# Patient Record
Sex: Male | Born: 1980 | Race: White | Hispanic: No | Marital: Married | Smoking: Current every day smoker
Health system: Southern US, Community
[De-identification: ages and names within clinical notes are randomized; demographics above are authoritative.]

## PROBLEM LIST (undated history)

## (undated) DIAGNOSIS — B009 Herpesviral infection, unspecified: Secondary | ICD-10-CM

---

## 2009-08-19 ENCOUNTER — Ambulatory Visit: Payer: Self-pay | Admitting: Family Medicine

## 2009-08-19 DIAGNOSIS — S058X9A Other injuries of unspecified eye and orbit, initial encounter: Secondary | ICD-10-CM | POA: Insufficient documentation

## 2010-07-04 NOTE — Assessment & Plan Note (Signed)
Summary: LEFT EYE PAIN   Vital Signs:  Patient Profile:   30 Years Old Male CC:      left eye pain X this AM Height:     69 inches Weight:      154 pounds O2 Sat:      97 % O2 treatment:    Room Air Temp:     98.4 degrees F oral Pulse rate:   77 / minute Resp:     18 per minute BP sitting:   137 / 85  (right arm)  Pt. in pain?   yes    Location:   left eye    Type:       burning  Vitals Entered By: Lajean Saver RN (August 19, 2009 1:06 PM)               Vision Screening: Left eye w/o correction: 20 / 25 Right Eye w/o correction: 20 / 20 Both eyes w/o correction:  20/ 20          Updated Prior Medication List: No Medications Current Allergies: ! PAXIL (PAROXETINE HCL)History of Present Illness Chief Complaint: left eye pain X this AM History of Present Illness: Subjective:  Patient felt something in his left eye this morning and rubbed it with his sleeve. He then lavaged the eye with an eye solution.  He now has a persistent foreign body sensation.   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  REVIEW OF SYSTEMS Constitutional Symptoms      Denies fever, chills, night sweats, weight loss, weight gain, and fatigue.  Eyes       Complains of eye pain.      Denies change in vision, eye discharge, glasses, contact lenses, and eye surgery.      Comments: left eye Ear/Nose/Throat/Mouth       Denies hearing loss/aids, change in hearing, ear pain, ear discharge, dizziness, frequent runny nose, frequent nose bleeds, sinus problems, sore throat, hoarseness, and tooth pain or bleeding.  Respiratory       Denies dry cough, productive cough, wheezing, shortness of breath, asthma, bronchitis, and emphysema/COPD.  Cardiovascular       Denies murmurs, chest pain, and tires easily with exhertion.    Gastrointestinal       Denies stomach pain, nausea/vomiting, diarrhea, constipation, blood in bowel movements, and indigestion. Genitourniary       Denies painful urination, kidney  stones, and loss of urinary control. Neurological       Denies paralysis, seizures, and fainting/blackouts. Musculoskeletal       Denies muscle pain, joint pain, joint stiffness, decreased range of motion, redness, swelling, muscle weakness, and gout.  Skin       Denies bruising, unusual mles/lumps or sores, and hair/skin or nail changes.  Psych       Denies mood changes, temper/anger issues, anxiety/stress, speech problems, depression, and sleep problems. Other Comments: Patient awoke this AM and felt like "something was in my eye". attempted to flush it out with water usuccessfully   Past History:  Past Medical History: Unremarkable  Past Surgical History: Denies surgical history  Social History: Occupation: HVAC Married Current Smoker 1/2 PPD X 9 years Alcohol use-yes Drug use-no Smoking Status:  current Drug Use:  no   Objective:  Appearance:  Patient appears healthy, stated age, and in no acute distress  Eyes:  Pupils are equal, round, and reactive to light and accomdation.  Extraocular movement is intact.  Conjunctivae are not inflamed.  Fundi benign.  Mild left photophobia.  Instilled tetracaine anesthetic in left eye  Left lid eversion negative for foreign body.  Flourescein to left eye shows a superficial linear abrasion 1mm by 4mm on the lower aspect of cornea Assessment New Problems: CORNEAL ABRASION, LEFT (ICD-918.1)   Plan New Medications/Changes: LORTAB 5 5-500 MG TABS (HYDROCODONE-ACETAMINOPHEN) One or two tabs by mouth hs as needed pain  #8 (eight) x 0, 08/19/2009, Donna Christen MD ERYTHROMYCIN 5 MG/GM OINT (ERYTHROMYCIN) Apply in affected eye q2 to 3hr  #3.5gm x 0, 08/19/2009, Donna Christen MD  New Orders: New Patient Level III [id number] Planning Comments:   Begin erythromycin opthalmic ointment.  Rx for analgesic at bedtime. Follow-up with ophthalmologist if not healed 2 to 3 days.  Follow-up also for worsenng symptoms   The patient and/or caregiver  has been counseled thoroughly with regard to medications prescribed including dosage, schedule, interactions, rationale for use, and possible side effects and they verbalize understanding.  Diagnoses and expected course of recovery discussed and will return if not improved as expected or if the condition worsens. Patient and/or caregiver verbalized understanding.  Prescriptions: LORTAB 5 5-500 MG TABS (HYDROCODONE-ACETAMINOPHEN) One or two tabs by mouth hs as needed pain  #8 (eight) x 0   Entered and Authorized by:   Donna Christen MD   Signed by:   Donna Christen MD on 08/19/2009   Method used:   Print then Give to Patient   RxID:   [id number] ERYTHROMYCIN 5 MG/GM OINT (ERYTHROMYCIN) Apply in affected eye q2 to 3hr  #3.5gm x 0   Entered and Authorized by:   Donna Christen MD   Signed by:   Donna Christen MD on 08/19/2009   Method used:   Print then Give to Patient   RxID:   [id number]   Patient Instructions: 1)  May take Ibuprofen 200mg , 4 tabs every 8 hours with food  2)  Wear sunglasses. 3)  Followup with ophthalmologist if not improved 2 days.

## 2012-02-20 ENCOUNTER — Emergency Department (HOSPITAL_BASED_OUTPATIENT_CLINIC_OR_DEPARTMENT_OTHER)
Admission: EM | Admit: 2012-02-20 | Discharge: 2012-02-21 | Disposition: A | Discharge status: Home / Self Care | Payer: Self-pay | Attending: Emergency Medicine | Admitting: Emergency Medicine

## 2012-02-20 ENCOUNTER — Encounter (HOSPITAL_BASED_OUTPATIENT_CLINIC_OR_DEPARTMENT_OTHER): Payer: Self-pay

## 2012-02-20 DIAGNOSIS — A64 Unspecified sexually transmitted disease: Secondary | ICD-10-CM

## 2012-02-20 DIAGNOSIS — F17200 Nicotine dependence, unspecified, uncomplicated: Secondary | ICD-10-CM | POA: Insufficient documentation

## 2012-02-20 DIAGNOSIS — B009 Herpesviral infection, unspecified: Secondary | ICD-10-CM | POA: Insufficient documentation

## 2012-02-20 DIAGNOSIS — N342 Other urethritis: Secondary | ICD-10-CM

## 2012-02-20 DIAGNOSIS — Z202 Contact with and (suspected) exposure to infections with a predominantly sexual mode of transmission: Secondary | ICD-10-CM | POA: Insufficient documentation

## 2012-02-20 DIAGNOSIS — R300 Dysuria: Secondary | ICD-10-CM | POA: Insufficient documentation

## 2012-02-20 HISTORY — DX: Herpesviral infection, unspecified: B00.9

## 2012-02-20 LAB — URINALYSIS, ROUTINE W REFLEX MICROSCOPIC
Bilirubin Urine: NEGATIVE
Hgb urine dipstick: NEGATIVE
Specific Gravity, Urine: 1.029 (ref 1.005–1.030)
Urobilinogen, UA: 0.2 mg/dL (ref 0.0–1.0)
pH: 6 (ref 5.0–8.0)

## 2012-02-20 LAB — URINE MICROSCOPIC-ADD ON

## 2012-02-20 MED ORDER — AZITHROMYCIN 1 G PO PACK
2.0000 g | PACK | Freq: Once | ORAL | Status: AC
  Administered 2012-02-20: 2 g via ORAL
  Filled 2012-02-20: qty 2

## 2012-02-20 MED ORDER — CEFTRIAXONE SODIUM 250 MG IJ SOLR
250.0000 mg | Freq: Once | INTRAMUSCULAR | Status: AC
  Administered 2012-02-20: 250 mg via INTRAMUSCULAR
  Filled 2012-02-20: qty 250

## 2012-02-20 NOTE — ED Provider Notes (Signed)
History     CSN: [id number]  Arrival date & time 02/20/12  2152   First MD Initiated Contact with Patient 02/20/12 2249      Chief Complaint  Patient presents with  . Dysuria    (Consider location/radiation/quality/duration/timing/severity/associated sxs/prior treatment) HPI Maxwell Houston is a 31 y.o. male no pertinent medical history presents with 3 days history of intermittent dysuria which is described as 2/10, burning he does have some clear to off white discharge. Denies any hematuria, testicular pain, rectal pain, fevers, chills nausea or vomiting. Patient's ex-girlfriend called him today and told him she had chlamydia, he had sexual intercourse with her about 2 weeks ago.  Past Medical History  Diagnosis Date  . Herpes     History reviewed. No pertinent past surgical history.  No family history on file.  History  Substance Use Topics  . Smoking status: Current Every Day Smoker  . Smokeless tobacco: Not on file  . Alcohol Use: Yes      Review of SystemsAt least 10pt or greater review of systems completed and are negative except where specified in the HPI.   Allergies  Paroxetine  Home Medications  No current outpatient prescriptions on file.  BP 125/69  Pulse 67  Temp 98.2 F (36.8 C) (Oral)  Resp 16  Ht 5\' 10"  (1.778 m)  Wt 165 lb (74.844 kg)  BMI 23.68 kg/m2  SpO2 98%  Physical Exam  Nursing notes reviewed.  Electronic medical record reviewed. VITAL SIGNS:   Filed Vitals:   02/20/12 2200  BP: 125/69  Pulse: 67  Temp: 98.2 F (36.8 C)  TempSrc: Oral  Resp: 16  Height: 5\' 10"  (1.778 m)  Weight: 165 lb (74.844 kg)  SpO2: 98%   CONSTITUTIONAL: Awake, oriented, appears non-toxic HENT: Atraumatic, normocephalic, oral mucosa pink and moist, airway patent. Nares patent without drainage. External ears normal. EYES: Conjunctiva clear, EOMI, PERRLA NECK: Trachea midline, non-tender, supple CARDIOVASCULAR: Normal heart rate, Normal rhythm, No  murmurs, rubs, gallops PULMONARY/CHEST: Clear to auscultation, no rhonchi, wheezes, or rales. Symmetrical breath sounds. Non-tender. ABDOMINAL: Non-distended, soft, non-tender - no rebound or guarding.  BS normal. Small ventral hernia midway between the xiphoid process and umbilicus GU: Normal circumcised male, thin gray opaque penile discharge. No rashes or sores. No lymphadenopathy. No testicular pain, no hernias EXTREMITIES: No clubbing, cyanosis, or edema SKIN: Warm, Dry, No erythema, No rash  ED Course  Procedures (including critical care time)  Labs Reviewed  URINALYSIS, ROUTINE W REFLEX MICROSCOPIC - Abnormal; Notable for the following:    Leukocytes, UA MODERATE (*)     All other components within normal limits  URINE MICROSCOPIC-ADD ON - Abnormal; Notable for the following:    Squamous Epithelial / LPF FEW (*)     Bacteria, UA FEW (*)     All other components within normal limits  GC/CHLAMYDIA PROBE AMP, GENITAL   No results found.   No diagnosis found.    MDM  Maxwell Houston is a 31 y.o. male presenting with dysuria, urethritis with the patient's history likely has chlamydia perhaps also gonorrhea. We'll treat the patient for both, swab has been sent for GC Chlamydia, he is referred to the health department for further testing for HIV/syphilis. Patient has not had sex with anybody else, no else is been exposed.  He understands to follow up with health department for further testing.  Discharged home in good condition: Stable.        Jones Skene, MD 02/20/12 2359

## 2012-02-20 NOTE — ED Notes (Signed)
C/o dysuria x 1 week-was advised today of STD exposure

## 2012-02-21 LAB — GC/CHLAMYDIA PROBE AMP, GENITAL
Chlamydia, DNA Probe: POSITIVE — AB
GC Probe Amp, Genital: NEGATIVE

## 2012-02-22 ENCOUNTER — Telehealth (HOSPITAL_COMMUNITY): Payer: Self-pay | Admitting: *Deleted

## 2012-02-22 NOTE — ED Notes (Signed)
+   chlamydia Patient treated with rocephin and Zithromax.[facility] letter

## 2012-02-27 ENCOUNTER — Telehealth (HOSPITAL_COMMUNITY): Payer: Self-pay | Admitting: *Deleted

## 2012-02-27 NOTE — ED Notes (Signed)
Unable to contact via phone. letter sent to EDP office.

## 2012-09-15 ENCOUNTER — Ambulatory Visit (INDEPENDENT_AMBULATORY_CARE_PROVIDER_SITE_OTHER): Payer: Self-pay | Admitting: Family Medicine

## 2012-09-15 ENCOUNTER — Encounter: Payer: Self-pay | Admitting: Family Medicine

## 2012-09-15 VITALS — BP 113/68 | HR 51 | Ht 68.5 in | Wt 150.0 lb

## 2012-09-15 DIAGNOSIS — Z0000 Encounter for general adult medical examination without abnormal findings: Secondary | ICD-10-CM

## 2012-09-15 DIAGNOSIS — Z024 Encounter for examination for driving license: Secondary | ICD-10-CM
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
## 2015-03-08 ENCOUNTER — Emergency Department (HOSPITAL_BASED_OUTPATIENT_CLINIC_OR_DEPARTMENT_OTHER): Payer: Self-pay

## 2015-03-08 ENCOUNTER — Encounter (HOSPITAL_BASED_OUTPATIENT_CLINIC_OR_DEPARTMENT_OTHER): Payer: Self-pay | Admitting: *Deleted

## 2015-03-08 ENCOUNTER — Emergency Department (HOSPITAL_BASED_OUTPATIENT_CLINIC_OR_DEPARTMENT_OTHER)
Admission: EM | Admit: 2015-03-08 | Discharge: 2015-03-08 | Disposition: A | Discharge status: Home / Self Care | Payer: Self-pay | Attending: Emergency Medicine | Admitting: Emergency Medicine

## 2015-03-08 DIAGNOSIS — R109 Unspecified abdominal pain: Secondary | ICD-10-CM

## 2015-03-08 DIAGNOSIS — Z720 Tobacco use: Secondary | ICD-10-CM | POA: Insufficient documentation

## 2015-03-08 DIAGNOSIS — Z8619 Personal history of other infectious and parasitic diseases: Secondary | ICD-10-CM | POA: Insufficient documentation

## 2015-03-08 DIAGNOSIS — N200 Calculus of kidney: Secondary | ICD-10-CM | POA: Insufficient documentation

## 2015-03-08 LAB — COMPREHENSIVE METABOLIC PANEL
ALT: 25 U/L (ref 17–63)
AST: 23 U/L (ref 15–41)
Albumin: 4.5 g/dL (ref 3.5–5.0)
Alkaline Phosphatase: 59 U/L (ref 38–126)
Anion gap: 5 (ref 5–15)
BUN: 13 mg/dL (ref 6–20)
CHLORIDE: 107 mmol/L (ref 101–111)
CO2: 26 mmol/L (ref 22–32)
CREATININE: 1.16 mg/dL (ref 0.61–1.24)
Calcium: 9.3 mg/dL (ref 8.9–10.3)
GFR calc Af Amer: >60 mL/min (ref >60)
Glucose, Bld: 124 mg/dL — ABNORMAL HIGH (ref 65–99)
Potassium: 3.5 mmol/L (ref 3.5–5.1)
Sodium: 138 mmol/L (ref 135–145)
Total Bilirubin: 0.8 mg/dL (ref 0.3–1.2)
Total Protein: 7.4 g/dL (ref 6.5–8.1)

## 2015-03-08 LAB — URINE MICROSCOPIC-ADD ON

## 2015-03-08 LAB — CBC WITH DIFFERENTIAL/PLATELET
BASOS ABS: 0 10*3/uL (ref 0.0–0.1)
Basophils Relative: 0 %
EOS PCT: 1 %
Eosinophils Absolute: 0.1 10*3/uL (ref 0.0–0.7)
HEMATOCRIT: 46.6 % (ref 39.0–52.0)
HEMOGLOBIN: 15.9 g/dL (ref 13.0–17.0)
LYMPHS PCT: 38 %
Lymphs Abs: 3.1 10*3/uL (ref 0.7–4.0)
MCH: 30.5 pg (ref 26.0–34.0)
MCHC: 34.1 g/dL (ref 30.0–36.0)
MCV: 89.3 fL (ref 78.0–100.0)
Monocytes Absolute: 0.8 10*3/uL (ref 0.1–1.0)
Monocytes Relative: 10 %
NEUTROS ABS: 4.2 10*3/uL (ref 1.7–7.7)
NEUTROS PCT: 51 %
PLATELETS: 165 10*3/uL (ref 150–400)
RBC: 5.22 MIL/uL (ref 4.22–5.81)
RDW: 13.1 % (ref 11.5–15.5)
WBC: 8.2 10*3/uL (ref 4.0–10.5)

## 2015-03-08 LAB — URINALYSIS, ROUTINE W REFLEX MICROSCOPIC
Bilirubin Urine: NEGATIVE
GLUCOSE, UA: NEGATIVE mg/dL
KETONES UR: 15 mg/dL — AB
LEUKOCYTES UA: NEGATIVE
Nitrite: NEGATIVE
PH: 6 (ref 5.0–8.0)
Protein, ur: 100 mg/dL — AB
Specific Gravity, Urine: 1.027 (ref 1.005–1.030)
Urobilinogen, UA: 1 mg/dL (ref 0.0–1.0)

## 2015-03-08 LAB — RAPID HIV SCREEN (HIV 1/2 AB+AG)
HIV 1/2 Antibodies: NONREACTIVE
HIV-1 P24 ANTIGEN - HIV24: NONREACTIVE

## 2015-03-08 LAB — LIPASE, BLOOD: LIPASE: 18 U/L — AB (ref 22–51)

## 2015-03-08 LAB — I-STAT CG4 LACTIC ACID, ED: Lactic Acid, Venous: 1.35 mmol/L (ref 0.5–2.0)

## 2015-03-08 MED ORDER — MORPHINE SULFATE (PF) 4 MG/ML IV SOLN
4.0000 mg | Freq: Once | INTRAVENOUS | Status: AC
  Administered 2015-03-08: 4 mg via INTRAVENOUS
  Filled 2015-03-08: qty 1

## 2015-03-08 MED ORDER — KETOROLAC TROMETHAMINE 30 MG/ML IJ SOLN
30.0000 mg | Freq: Once | INTRAMUSCULAR | Status: AC
  Administered 2015-03-08: 30 mg via INTRAVENOUS
  Filled 2015-03-08: qty 1

## 2015-03-08 MED ORDER — ONDANSETRON HCL 4 MG/2ML IJ SOLN
4.0000 mg | Freq: Once | INTRAMUSCULAR | Status: AC
  Administered 2015-03-08: 4 mg via INTRAVENOUS
  Filled 2015-03-08: qty 2

## 2015-03-08 MED ORDER — TAMSULOSIN HCL 0.4 MG PO CAPS: 0.4000 mg | ORAL_CAPSULE | Freq: Every day | ORAL | Status: DC

## 2015-03-08 MED ORDER — CEFTRIAXONE SODIUM 250 MG IJ SOLR
INTRAMUSCULAR | Status: AC
  Administered 2015-03-08: 250 mg
  Filled 2015-03-08: qty 250

## 2015-03-08 MED ORDER — HYDROMORPHONE HCL 1 MG/ML IJ SOLN
1.0000 mg | Freq: Once | INTRAMUSCULAR | Status: AC
  Administered 2015-03-08: 1 mg via INTRAVENOUS
  Filled 2015-03-08: qty 1

## 2015-03-08 MED ORDER — CEFTRIAXONE SODIUM 500 MG IJ SOLR
250.0000 mg | Freq: Once | INTRAMUSCULAR | Status: AC
  Administered 2015-03-08: 250 mg via INTRAVENOUS
  Filled 2015-03-08: qty 250

## 2015-03-08 MED ORDER — AZITHROMYCIN 250 MG PO TABS
1000.0000 mg | ORAL_TABLET | Freq: Once | ORAL | Status: AC
Start: 2015-03-08 — End: 2015-03-08
  Administered 2015-03-08: 1000 mg via ORAL
  Filled 2015-03-08: qty 4

## 2015-03-08 MED ORDER — ONDANSETRON 4 MG PO TBDP: ORAL_TABLET | ORAL | Status: DC

## 2015-03-08 MED ORDER — OXYCODONE HCL 5 MG PO TABS
5.0000 mg | ORAL_TABLET | ORAL | Status: DC | PRN
Start: 2015-03-08 — End: 2015-07-22

## 2015-03-08 MED ORDER — SODIUM CHLORIDE 0.9 % IV SOLN
1000.0000 mL | Freq: Once | INTRAVENOUS | Status: AC
  Administered 2015-03-08: 1000 mL via INTRAVENOUS

## 2015-03-08 NOTE — ED Notes (Signed)
Patient states he was awakened this morning with right flank pain which is radiating into right lower abdomen.  No history of kidney stones. Urinating bloody colored urine.

## 2015-03-08 NOTE — ED Provider Notes (Signed)
CSN: [id number]     Arrival date & time 03/08/15  7829 History   First MD Initiated Contact with Patient 03/08/15 0945     Chief Complaint  Patient presents with  . Flank Pain     (Consider location/radiation/quality/duration/timing/severity/associated sxs/prior Treatment) Patient is a 34 y.o. male presenting with abdominal pain. The history is provided by the patient.  Abdominal Pain Pain location:  R flank Pain quality: sharp and shooting   Pain radiates to:  Groin Pain severity:  Severe Onset quality:  Sudden Duration:  2 hours Timing:  Constant Progression:  Worsening Chronicity:  New Relieved by:  Nothing Worsened by:  Nothing tried Ineffective treatments:  None tried Associated symptoms: hematuria   Associated symptoms: no chest pain, no chills, no diarrhea, no fever, no nausea, no shortness of breath and no vomiting    34 yo M with a chief complaint of right flank pain. This woke him up this morning. Sharp severe radiates to his right groin. Has no history of kidney stones in the past. Patient denies fevers or chills denies injury. Denies nausea or vomiting.  Past Medical History  Diagnosis Date  . Herpes    No past surgical history on file. No family history on file. Social History  Substance Use Topics  . Smoking status: Current Every Day Smoker -- 1.00 packs/day for 4 years    Types: Cigarettes  . Smokeless tobacco: None  . Alcohol Use: 0.5 oz/week    1 drink(s) per week    Review of Systems  Constitutional: Negative for fever and chills.  HENT: Negative for congestion and facial swelling.   Eyes: Negative for discharge and visual disturbance.  Respiratory: Negative for shortness of breath.   Cardiovascular: Negative for chest pain and palpitations.  Gastrointestinal: Negative for nausea, vomiting, abdominal pain and diarrhea.  Genitourinary: Positive for hematuria.  Musculoskeletal: Negative for myalgias and arthralgias.  Skin: Negative for color change  and rash.  Neurological: Negative for tremors, syncope and headaches.  Psychiatric/Behavioral: Negative for confusion and dysphoric mood.      Allergies  Paroxetine  Home Medications   Prior to Admission medications   Medication Sig Start Date End Date Taking? Authorizing Provider  ondansetron (ZOFRAN ODT) 4 MG disintegrating tablet  ODT q4 hours prn nausea/vomit 03/08/15   Melene Plan, DO  oxyCODONE (ROXICODONE) 5 MG immediate release tablet Take 1 tablet (5 mg total) by mouth every 4 (four) hours as needed for severe pain. 03/08/15   Melene Plan, DO  tamsulosin (FLOMAX) 0.4 MG CAPS capsule Take 1 capsule (0.4 mg total) by mouth daily after supper. 03/08/15   Melene Plan, DO   BP 100/61 mmHg  Pulse 71  Temp(Src) 98.1 F (36.7 C) (Oral)  Resp 20  Ht  (1.778 m)  Wt 168 lb (76.204 kg)  BMI 24.11 kg/m2  SpO2 99% Physical Exam  Constitutional: He is oriented to person, place, and time. He appears well-developed and well-nourished.  HENT:  Head: Normocephalic and atraumatic.  Eyes: EOM are normal. Pupils are equal, round, and reactive to light.  Neck: Normal range of motion. Neck supple. No JVD present.  Cardiovascular: Normal rate and regular rhythm.  Exam reveals no gallop and no friction rub.   No murmur heard. Pulmonary/Chest: No respiratory distress. He has no wheezes.  Abdominal: He exhibits no distension. There is tenderness (mild right flank tenderness.). There is no rebound and no guarding. Hernia confirmed negative in the right inguinal area and confirmed negative in the  left inguinal area.  Genitourinary: Testes normal. Cremasteric reflex is present. Right testis shows no mass and no tenderness. Left testis shows no mass and no tenderness. Circumcised.  Musculoskeletal: Normal range of motion.  Lymphadenopathy:       Right: No inguinal adenopathy present.       Left: No inguinal adenopathy present.  Neurological: He is alert and oriented to person, place, and time.   Skin: No rash noted. No pallor.  Psychiatric: He has a normal mood and affect. His behavior is normal.    ED Course  Procedures (including critical care time) Labs Review Labs Reviewed  COMPREHENSIVE METABOLIC PANEL - Abnormal; Notable for the following:    Glucose, Bld 124 (*)    All other components within normal limits  LIPASE, BLOOD - Abnormal; Notable for the following:    Lipase 18 (*)    All other components within normal limits  URINALYSIS, ROUTINE W REFLEX MICROSCOPIC (NOT AT [facility]) - Abnormal; Notable for the following:    Color, Urine RED (*)    APPearance CLOUDY (*)    Hgb urine dipstick LARGE (*)    Ketones, ur 15 (*)    Protein, ur 100 (*)    All other components within normal limits  URINE MICROSCOPIC-ADD ON - Abnormal; Notable for the following:    Bacteria, UA MANY (*)    All other components within normal limits  CBC WITH DIFFERENTIAL/PLATELET  RAPID HIV SCREEN (HIV 1/2 AB+AG)  RPR  HIV ANTIBODY (ROUTINE TESTING)  I-STAT CG4 LACTIC ACID, ED  I-STAT CG4 LACTIC ACID, ED  GC/CHLAMYDIA PROBE AMP ([place]) NOT AT [facility]    Imaging Review Ct Renal Stone Study  03/08/2015   CLINICAL DATA:  Right flank pain and hematuria for 1 day  EXAM: CT ABDOMEN AND PELVIS WITHOUT CONTRAST  TECHNIQUE: Multidetector CT imaging of the abdomen and pelvis was performed following the standard protocol without oral or intravenous contrast material administration.  COMPARISON:  None.  FINDINGS: Lower chest:  Lung bases are clear.  Hepatobiliary: No focal liver lesions are identified on this noncontrast enhanced study. Gallbladder wall is not appreciably thickened. There is no biliary duct dilatation.  Pancreas: No pancreatic mass or inflammatory focus.  Spleen: No splenic lesion identified.  Adrenals/Urinary Tract: Adrenals appear normal bilaterally. Left kidney appears normal without mass or hydronephrosis. There is no left-sided renal or ureteral calculus. Right kidney is mildly  edematous. There is no right renal mass. There is a tiny calculus in the lower pole the right kidney. There is moderate hydronephrosis on the right. There is a 2 mm calculus at the right ureterovesical junction. No other ureteral calculus is seen. The urinary bladder is decompressed.  Stomach/Bowel: No bowel wall or mesenteric thickening. No bowel obstruction. No free air or portal venous air.  Vascular/Lymphatic: No evidence of abdominal aortic aneurysm. No vascular lesion is identified on this noncontrast enhanced study. There is no adenopathy in the abdomen or pelvis.  Reproductive: No prostatic lesions are identified. No pelvic mass or pelvic fluid collection.  Other: No periappendiceal region inflammation. No abscess or ascites in the abdomen or pelvis.  Musculoskeletal: No blastic or lytic bone lesions are appreciable. No intramuscular lesion or abdominal wall lesion.  IMPRESSION: 2 mm calculus right ureterovesical junction with moderate hydronephrosis on the right and mild right renal edema. Tiny calculus lower pole right kidney.  No renal or ureteral calculus on the left. No left-sided hydronephrosis.  No bowel obstruction. No abscess. Periappendiceal region unremarkable.  Electronically Signed   By: Bretta Bang III M.D.   On: 03/08/2015 10:33   I have personally reviewed and evaluated these images and lab results as part of my medical decision-making.   EKG Interpretation None      MDM   Final diagnoses:  Flank pain  Nephrolithiasis    34 yo M with a chief complaint of right flank pain. History consistent with kidney stone. No history of the same. Will obtain a CT stone study.  2 mm kidney stone on CT scan. Discussed results with patient. Will give Toradol.  Patient continuing to have significant pain. Will treat with Dilaudid.  Patient's pain is improved. Discharged patient home.   I have discussed the diagnosis/risks/treatment options with the patient and family and believe  the pt to be eligible for discharge home to follow-up with PCP. We also discussed returning to the ED immediately if new or worsening sx occur. We discussed the sx which are most concerning (e.g., sudden worsening pain, fever, inability to tolerate by mouth) that necessitate immediate return. Medications administered to the patient during their visit and any new prescriptions provided to the patient are listed below.  Medications given during this visit Medications  0.9 %  sodium chloride infusion (0 mLs Intravenous Stopped 03/08/15 1030)  morphine 4 MG/ML injection 4 mg (4 mg Intravenous Given 03/08/15 0953)  ondansetron (ZOFRAN) injection 4 mg (4 mg Intravenous Given 03/08/15 0956)  morphine 4 MG/ML injection 4 mg (4 mg Intravenous Given 03/08/15 1040)  ketorolac (TORADOL) 30 MG/ML injection 30 mg (30 mg Intravenous Given 03/08/15 1104)  cefTRIAXone (ROCEPHIN) 250 mg in dextrose 5 % 50 mL IVPB (0 mg Intravenous Stopped 03/08/15 1148)  azithromycin (ZITHROMAX) tablet 1,000 mg (1,000 mg Oral Given 03/08/15 1107)  cefTRIAXone (ROCEPHIN) 250 MG injection (250 mg  Given 03/08/15 1109)  0.9 %  sodium chloride infusion (0 mLs Intravenous Stopped 03/08/15 1215)  HYDROmorphone (DILAUDID) injection 1 mg (1 mg Intravenous Given 03/08/15 1205)  ondansetron (ZOFRAN) injection 4 mg (4 mg Intravenous Given 03/08/15 1203)    Discharge Medication List as of 03/08/2015 12:48 PM    START taking these medications   Details  ondansetron (ZOFRAN ODT) 4 MG disintegrating tablet 4mg  ODT q4 hours prn nausea/vomit, Print    oxyCODONE (ROXICODONE) 5 MG immediate release tablet Take 1 tablet (5 mg total) by mouth every 4 (four) hours as needed for severe pain., Starting 03/08/2015, Until Discontinued, Print    tamsulosin (FLOMAX) 0.4 MG CAPS capsule Take 1 capsule (0.4 mg total) by mouth daily after supper., Starting 03/08/2015, Until Discontinued, Print        The patient appears reasonably screen and/or stabilized for  discharge and I doubt any other medical condition or other [facility] requiring further screening, evaluation, or treatment in the ED at this time prior to discharge.    Melene Plan, DO 03/08/15 1450

## 2015-03-08 NOTE — ED Notes (Signed)
Coffee given to family member. Strainer and container given to pt to take home and instructed to strain urine.

## 2015-03-08 NOTE — ED Notes (Signed)
Coffee and warm blanket given to family member.

## 2015-03-08 NOTE — ED Notes (Signed)
Dr Adela Lank cancelled 2nd lactic acid.

## 2015-03-08 NOTE — Discharge Instructions (Signed)
Take 4 over the counter ibuprofen tablets 3 times a day or 2 over-the-counter naproxen tablets twice a day for pain. ° °Kidney Stones °Kidney stones (urolithiasis) are solid masses that form inside your kidneys. The intense pain is caused by the stone moving through the kidney, ureter, bladder, and urethra (urinary tract). When the stone moves, the ureter starts to spasm around the stone. The stone is usually passed in your pee (urine).  °HOME CARE °· Drink enough fluids to keep your pee clear or pale yellow. This helps to get the stone out. °· Strain all pee through the provided strainer. Do not pee without peeing through the strainer, not even once. If you pee the stone out, catch it in the strainer. The stone may be as small as a grain of salt. Take this to your doctor. This will help your doctor figure out what you can do to try to prevent more kidney stones. °· Only take medicine as told by your doctor. °· Follow up with your doctor as told. °· Get follow-up X-rays as told by your doctor. °GET HELP IF: °You have pain that gets worse even if you have been taking pain medicine. °GET HELP RIGHT AWAY IF:  °· Your pain does not get better with medicine. °· You have a fever or shaking chills. °· Your pain increases and gets worse over 18 hours. °· You have new belly (abdominal) pain. °· You feel faint or pass out. °· You are unable to pee. °MAKE SURE YOU:  °· Understand these instructions. °· Will watch your condition. °· Will get help right away if you are not doing well or get worse. °Document Released: 11/07/2007 Document Revised: 01/21/2013 Document Reviewed: 10/22/2012 °ExitCare® Patient Information ©2015 [employer]. This information is not intended to replace advice given to you by your health care provider. Make sure you discuss any questions you have with your health care provider. ° °

## 2015-03-09 LAB — RPR: RPR Ser Ql: NONREACTIVE

## 2015-03-09 LAB — GC/CHLAMYDIA PROBE AMP (~~LOC~~) NOT AT ARMC
Chlamydia: NEGATIVE
Neisseria Gonorrhea: NEGATIVE

## 2015-03-09 LAB — HIV ANTIBODY (ROUTINE TESTING W REFLEX): HIV Screen 4th Generation wRfx: NONREACTIVE

## 2015-07-22 ENCOUNTER — Encounter (HOSPITAL_BASED_OUTPATIENT_CLINIC_OR_DEPARTMENT_OTHER): Payer: Self-pay | Admitting: *Deleted

## 2015-07-22 ENCOUNTER — Emergency Department (HOSPITAL_BASED_OUTPATIENT_CLINIC_OR_DEPARTMENT_OTHER)
Admission: EM | Admit: 2015-07-22 | Discharge: 2015-07-22 | Disposition: A | Discharge status: Home / Self Care | Payer: BLUE CROSS/BLUE SHIELD | Attending: Emergency Medicine | Admitting: Emergency Medicine

## 2015-07-22 DIAGNOSIS — Y998 Other external cause status: Secondary | ICD-10-CM | POA: Insufficient documentation

## 2015-07-22 DIAGNOSIS — S6992XA Unspecified injury of left wrist, hand and finger(s), initial encounter: Secondary | ICD-10-CM | POA: Diagnosis present

## 2015-07-22 DIAGNOSIS — F17210 Nicotine dependence, cigarettes, uncomplicated: Secondary | ICD-10-CM | POA: Diagnosis not present

## 2015-07-22 DIAGNOSIS — Y92000 Kitchen of unspecified non-institutional (private) residence as  the place of occurrence of the external cause: Secondary | ICD-10-CM | POA: Insufficient documentation

## 2015-07-22 DIAGNOSIS — Z8619 Personal history of other infectious and parasitic diseases: Secondary | ICD-10-CM | POA: Diagnosis not present

## 2015-07-22 DIAGNOSIS — S61012A Laceration without foreign body of left thumb without damage to nail, initial encounter: Secondary | ICD-10-CM | POA: Insufficient documentation

## 2015-07-22 DIAGNOSIS — W260XXA Contact with knife, initial encounter: Secondary | ICD-10-CM | POA: Diagnosis not present

## 2015-07-22 DIAGNOSIS — Y9389 Activity, other specified: Secondary | ICD-10-CM | POA: Diagnosis not present

## 2015-07-22 DIAGNOSIS — S61412A Laceration without foreign body of left hand, initial encounter: Secondary | ICD-10-CM

## 2015-07-22 MED ORDER — LIDOCAINE HCL (PF) 1 % IJ SOLN
5.0000 mL | Freq: Once | INTRAMUSCULAR | Status: AC
  Administered 2015-07-22: 5 mL
  Filled 2015-07-22: qty 5

## 2015-07-22 MED ORDER — ACETAMINOPHEN 325 MG PO TABS
650.0000 mg | ORAL_TABLET | Freq: Once | ORAL | Status: AC
  Administered 2015-07-22: 650 mg via ORAL
  Filled 2015-07-22: qty 2

## 2015-07-22 NOTE — ED Provider Notes (Signed)
CSN: [id number]     Arrival date & time 07/22/15  1626 History   First MD Initiated Contact with Patient 07/22/15 1703     Chief Complaint  Patient presents with  . Extremity Laceration     (Consider location/radiation/quality/duration/timing/severity/associated sxs/prior Treatment) Patient is a 35 y.o. male presenting with skin laceration. The history is provided by the patient.  Laceration Location:  Finger Finger laceration location:  L thumb Length (cm):  1.5 Depth:  Through underlying tissue Bleeding: controlled   Time since incident:  1 hour Laceration mechanism:  Knife Pain details:    Quality:  Aching   Severity:  Mild   Timing:  Constant   Progression:  Unchanged Foreign body present:  No foreign bodies Relieved by:  Pressure Worsened by:  Nothing tried Tetanus status:  Up to date  Amun Stemm is a 35 y.o. male who presents to the ED with a laceration to the left thumb. Patient cut his thumb with a kitchen knife while cutting veggies. Past Medical History  Diagnosis Date  . Herpes    History reviewed. No pertinent past surgical history. History reviewed. No pertinent family history. Social History  Substance Use Topics  . Smoking status: Current Every Day Smoker -- 1.00 packs/day for 4 years    Types: Cigarettes  . Smokeless tobacco: None  . Alcohol Use: 0.5 oz/week    1 drink(s) per week    Review of Systems  Skin: Positive for wound.   All other systems negative   Allergies  Paroxetine  Home Medications   Prior to Admission medications   Not on File   BP 106/60 mmHg  Pulse 60  Temp(Src) 98.1 F (36.7 C) (Oral)  Resp 16  Ht  (1.753 m)  Wt 72.576 kg  BMI 23.62 kg/m2  SpO2 98% Physical Exam  Constitutional: He is oriented to person, place, and time. He appears well-developed and well-nourished. No distress.  HENT:  Head: Normocephalic and atraumatic.  Eyes: Conjunctivae and EOM are normal.  Neck: Normal range of motion. Neck  supple.  Cardiovascular: Normal rate.   Pulmonary/Chest: Effort normal.  Musculoskeletal: Normal range of motion.       Left hand: He exhibits laceration. Normal sensation noted. Normal strength noted. He exhibits no thumb/finger opposition.       Hands: Left thumb with laceration to the dorsal aspect. Normal strength, full range of motion, good touch sensation.   Neurological: He is alert and oriented to person, place, and time. No cranial nerve deficit.  Skin: Skin is warm and dry.  Psychiatric: He has a normal mood and affect. His behavior is normal.  Nursing note and vitals reviewed.   ED Course  .Marland KitchenLaceration Repair Date/Time: 07/22/2015 6:11 PM Performed by: Janne Napoleon Authorized by: Janne Napoleon Consent: Verbal consent obtained. Risks and benefits: risks, benefits and alternatives were discussed Consent given by: patient Patient understanding: patient states understanding of the procedure being performed Required items: required blood products, implants, devices, and special equipment available Patient identity confirmed: verbally with patient Body area: upper extremity Location details: left hand Laceration length: 3 cm Tendon involvement: none Nerve involvement: none Vascular damage: no Anesthesia: local infiltration Local anesthetic: lidocaine 1% without epinephrine Patient sedated: no Preparation: Patient was prepped and draped in the usual sterile fashion. Irrigation solution: saline Irrigation method: syringe Amount of cleaning: standard Debridement: none Degree of undermining: none Skin closure: 4-0 Prolene Number of sutures: 5 Technique: simple Approximation: close Approximation difficulty: simple Dressing: pressure  dressing Patient tolerance: Patient tolerated the procedure well with no immediate complications     MDM  35 y.o. male with laceration to the left thumb stable for d/c without focal neuro deficits. Discussed plan of care and patient will  follow up with his PCP for suture removal in 7 days. He will return sooner for any problems.   Final diagnoses:  Laceration of left hand, initial encounter       [facility], NP 07/22/15 1840  Azalia Bilis, MD 07/23/15 [phone]

## 2015-07-22 NOTE — ED Notes (Signed)
Laceration to left thumb with kitchen knife.  

## 2015-07-22 NOTE — Discharge Instructions (Signed)
Sutured Wound Care Sutures are stitches that can be used to close wounds. Taking care of your wound properly can help to prevent pain and infection. It can also help your wound to heal more quickly. HOW TO CARE FOR YOUR SUTURED WOUND Wound Care  Keep the wound clean and dry.  If you were given a bandage (dressing), you should change it at least once per day or as directed by your health care provider. You should also change it if it becomes wet or dirty.  Keep the wound completely dry for the first 24 hours or as directed by your health care provider. After that time, you may shower or bathe. However, make sure that the wound is not soaked in water until the sutures have been removed.  Clean the wound one time each day or as directed by your health care provider.  Wash the wound with soap and water.  Rinse the wound with water to remove all soap.  Pat the wound dry with a clean towel. Do not rub the wound.  Aftercleaning the wound, apply a thin layer of antibioticointment as directed by your health care provider. This will help to prevent infection and keep the dressing from sticking to the wound.  Have the sutures removed as directed by your health care provider. General Instructions  Take or apply medicines only as directed by your health care provider.  To help prevent scarring, make sure to cover your wound with sunscreen whenever you are outside after the sutures are removed and the wound is healed. Make sure to wear a sunscreen of at least 30 SPF.  If you were prescribed an antibiotic medicine or ointment, finish all of it even if you start to feel better.  Do not scratch or pick at the wound.  Keep all follow-up visits as directed by your health care provider. This is important.  Check your wound every day for signs of infection. Watch for:   Redness, swelling, or pain.  Fluid, blood, or pus.  Raise (elevate) the injured area above the level of your heart while you  are sitting or lying down, if possible.  Avoid stretching your wound.  Drink enough fluids to keep your urine clear or pale yellow. SEEK MEDICAL CARE IF:  You received a tetanus shot and you have swelling, severe pain, redness, or bleeding at the injection site.  You have a fever.  A wound that was closed breaks open.  You notice a bad smell coming from the wound.  You notice something coming out of the wound, such as wood or glass.  Your pain is not controlled with medicine.  You have increased redness, swelling, or pain at the site of your wound.  You have fluid, blood, or pus coming from your wound.  You notice a change in the color of your skin near your wound.  You need to change the dressing frequently due to fluid, blood, or pus draining from the wound.  You develop a new rash.  You develop numbness around the wound. SEEK IMMEDIATE MEDICAL CARE IF:  You develop severe swelling around the injury site.  Your pain suddenly increases and is severe.  You develop painful lumps near the wound or on skin that is anywhere on your body.  You have a red streak going away from your wound.  The wound is on your hand or foot and you cannot properly move a finger or toe.  The wound is on your hand or foot and   you notice that your fingers or toes look pale or bluish.   This information is not intended to replace advice given to you by your health care provider. Make sure you discuss any questions you have with your health care provider.   Document Released: 06/28/2004 Document Revised: 10/05/2014 Document Reviewed: 12/31/2012 Elsevier Interactive Patient Education 2016 [employer]  

## 2017-08-05 ENCOUNTER — Emergency Department (HOSPITAL_COMMUNITY): Payer: No Typology Code available for payment source

## 2017-08-05 ENCOUNTER — Emergency Department (HOSPITAL_COMMUNITY)
Admission: EM | Admit: 2017-08-05 | Discharge: 2017-08-05 | Disposition: A | Discharge status: Home / Self Care | Payer: No Typology Code available for payment source | Attending: Emergency Medicine | Admitting: Emergency Medicine

## 2017-08-05 ENCOUNTER — Encounter (HOSPITAL_COMMUNITY): Payer: Self-pay

## 2017-08-05 DIAGNOSIS — M5030 Other cervical disc degeneration, unspecified cervical region: Secondary | ICD-10-CM | POA: Insufficient documentation

## 2017-08-05 DIAGNOSIS — F17210 Nicotine dependence, cigarettes, uncomplicated: Secondary | ICD-10-CM | POA: Insufficient documentation

## 2017-08-05 DIAGNOSIS — Y939 Activity, unspecified: Secondary | ICD-10-CM | POA: Insufficient documentation

## 2017-08-05 DIAGNOSIS — Y92410 Unspecified street and highway as the place of occurrence of the external cause: Secondary | ICD-10-CM | POA: Diagnosis not present

## 2017-08-05 DIAGNOSIS — Y998 Other external cause status: Secondary | ICD-10-CM | POA: Diagnosis not present

## 2017-08-05 DIAGNOSIS — S0990XA Unspecified injury of head, initial encounter: Secondary | ICD-10-CM | POA: Diagnosis present

## 2017-08-05 MED ORDER — BACLOFEN 10 MG PO TABS: 10.0000 mg | ORAL_TABLET | Freq: Three times a day (TID) | ORAL | 0 refills | Status: AC

## 2017-08-05 MED ORDER — MELOXICAM 15 MG PO TABS: 15.0000 mg | ORAL_TABLET | Freq: Every day | ORAL | 0 refills | Status: AC

## 2017-08-05 NOTE — ED Notes (Signed)
Patient transported to CT 
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
## 2017-08-05 NOTE — Discharge Instructions (Signed)
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
## 2020-09-05 ENCOUNTER — Other Ambulatory Visit: Payer: Self-pay | Admitting: Physical Medicine & Rehabilitation

## 2020-09-05 DIAGNOSIS — G8929 Other chronic pain: Secondary | ICD-10-CM

## 2020-09-21 ENCOUNTER — Other Ambulatory Visit: Payer: Self-pay

## 2020-09-21 ENCOUNTER — Ambulatory Visit
Admission: RE | Admit: 2020-09-21 | Discharge: 2020-09-21 | Disposition: A | Discharge status: Home / Self Care | Payer: Worker's Compensation | Source: Ambulatory Visit | Attending: Physical Medicine & Rehabilitation | Admitting: Physical Medicine & Rehabilitation

## 2020-09-21 DIAGNOSIS — G8929 Other chronic pain: Secondary | ICD-10-CM
